# Patient Record
Sex: Male | Born: 1995 | Race: White | Hispanic: No | Marital: Single | Smoking: Current every day smoker
Health system: Southern US, Community
[De-identification: ages and names within clinical notes are randomized; demographics above are authoritative.]

## PROBLEM LIST (undated history)

## (undated) DIAGNOSIS — J42 Unspecified chronic bronchitis: Secondary | ICD-10-CM

## (undated) DIAGNOSIS — S90511A Abrasion, right ankle, initial encounter: Secondary | ICD-10-CM

## (undated) DIAGNOSIS — S62009A Unspecified fracture of navicular [scaphoid] bone of unspecified wrist, initial encounter for closed fracture: Secondary | ICD-10-CM

## (undated) DIAGNOSIS — Z8614 Personal history of Methicillin resistant Staphylococcus aureus infection: Secondary | ICD-10-CM

## (undated) DIAGNOSIS — Z98811 Dental restoration status: Secondary | ICD-10-CM

## (undated) DIAGNOSIS — R0981 Nasal congestion: Secondary | ICD-10-CM

## (undated) HISTORY — PX: WISDOM TOOTH EXTRACTION: SHX21

---
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
## 2015-11-06 ENCOUNTER — Encounter (HOSPITAL_COMMUNITY): Payer: Self-pay

## 2015-11-06 ENCOUNTER — Emergency Department (HOSPITAL_COMMUNITY): Payer: Self-pay

## 2015-11-06 ENCOUNTER — Emergency Department (HOSPITAL_COMMUNITY)
Admission: EM | Admit: 2015-11-06 | Discharge: 2015-11-06 | Disposition: A | Discharge status: Home / Self Care | Payer: Self-pay | Attending: Emergency Medicine | Admitting: Emergency Medicine

## 2015-11-06 DIAGNOSIS — W19XXXA Unspecified fall, initial encounter: Secondary | ICD-10-CM | POA: Insufficient documentation

## 2015-11-06 DIAGNOSIS — Y999 Unspecified external cause status: Secondary | ICD-10-CM | POA: Insufficient documentation

## 2015-11-06 DIAGNOSIS — Y92830 Public park as the place of occurrence of the external cause: Secondary | ICD-10-CM | POA: Insufficient documentation

## 2015-11-06 DIAGNOSIS — S62001A Unspecified fracture of navicular [scaphoid] bone of right wrist, initial encounter for closed fracture: Secondary | ICD-10-CM | POA: Insufficient documentation

## 2015-11-06 DIAGNOSIS — Y939 Activity, unspecified: Secondary | ICD-10-CM | POA: Insufficient documentation

## 2015-11-06 DIAGNOSIS — F17210 Nicotine dependence, cigarettes, uncomplicated: Secondary | ICD-10-CM | POA: Insufficient documentation

## 2015-11-06 DIAGNOSIS — F1290 Cannabis use, unspecified, uncomplicated: Secondary | ICD-10-CM | POA: Insufficient documentation

## 2015-11-06 DIAGNOSIS — S62009A Unspecified fracture of navicular [scaphoid] bone of unspecified wrist, initial encounter for closed fracture: Secondary | ICD-10-CM

## 2015-11-06 HISTORY — DX: Unspecified fracture of navicular (scaphoid) bone of unspecified wrist, initial encounter for closed fracture: S62.009A

## 2015-11-06 MED ORDER — IBUPROFEN 600 MG PO TABS: 600.0000 mg | ORAL_TABLET | Freq: Four times a day (QID) | ORAL | Status: AC | PRN

## 2015-11-06 MED ORDER — HYDROCODONE-ACETAMINOPHEN 5-325 MG PO TABS: 1.0000 | ORAL_TABLET | ORAL | Status: AC | PRN

## 2015-11-06 MED ORDER — HYDROCODONE-ACETAMINOPHEN 5-325 MG PO TABS
1.0000 | ORAL_TABLET | Freq: Once | ORAL | Status: AC
  Administered 2015-11-06: 1 via ORAL
  Filled 2015-11-06: qty 1

## 2015-11-06 NOTE — Discharge Instructions (Signed)
Please follow-up with Dr. Roda ShuttersXu with orthopedics for reevaluation of your wrist fracture. Take your pain medicine as prescribed and as we discussed. Return to ED for any new or worsening symptoms as we discussed.  Scaphoid Fracture, Wrist A fracture is a break in the bone. The bone you have broken often does not show up as a fracture on x-ray until later on in the healing phase. This bone is called the scaphoid bone. With this bone, your caregiver will often cast or splint your wrist as though it is fractured, even if a fracture is not seen on the x-ray. This is often done with wrist injuries in which there is tenderness at the base of the thumb. An x-ray at 1-3 weeks after your injury may confirm this fracture. A cast or splint is used to protect and keep your injured bone in good position for healing. The cast or splint will be on generally for about 6 to 16 weeks, depending on your health, age, the fracture location and how quickly you heal. Another name for the scaphoid bone is the navicular bone. HOME CARE INSTRUCTIONS   To lessen the swelling and pain, keep the injured part elevated above your heart while sitting or lying down.  Apply ice to the injury for 15-20 minutes, 03-04 times per day while awake, for 2 days. Put the ice in a plastic bag and place a thin towel between the bag of ice and your cast.  If you have a plaster or fiberglass cast or splint:  Do not try to scratch the skin under the cast using sharp or pointed objects.  Check the skin around the cast every day. You may put lotion on any red or sore areas.  Keep your cast or splint dry and clean.  If you have a plaster splint:  Wear the splint as directed.  You may loosen the elastic bandage around the splint if your fingers become numb, tingle, or turn cold or blue.  If you have been put in a removable splint, wear and use as directed.  Do not put pressure on any part of your cast or splint; it may deform or break. Rest  your cast or splint only on a pillow the first 24 hours until it is fully hardened.  Your cast or splint can be protected during bathing with a plastic bag. Do not lower the cast or splint into water.  Only take over-the-counter or prescription medicines for pain, discomfort, or fever as directed by your caregiver.  If your caregiver has given you a follow up appointment, it is very important to keep that appointment. Not keeping the appointment could result in chronic pain and decreased function. If there is any problem keeping the appointment, you must call back to this facility for assistance. SEEK IMMEDIATE MEDICAL CARE IF:   Your cast gets damaged, wet or breaks.  You have continued severe pain or more swelling than you did before the cast or splint was put on.  Your skin or nails below the injury turn blue or gray, or feel cold or numb.  You have tingling or burning pain in your fingers or increasing pain with movement of your fingers   This information is not intended to replace advice given to you by your health care provider. Make sure you discuss any questions you have with your health care provider.   Document Released: 04/23/2002 Document Revised: 07/26/2011 Document Reviewed: 11/13/2014 Elsevier Interactive Patient Education [employer]2016 Elsevier Inc.

## 2015-11-06 NOTE — ED Notes (Signed)
Pt c/o R wrist pain r/t a fall earlier today.  Pain score 6/10.  Pt reports that he "landed funny."  Limited ROM noted.

## 2015-11-06 NOTE — ED Provider Notes (Signed)
CSN: [id number]     Arrival date & time 11/06/15  1557 History  By signing my name below, I, Calvin Mckenzie, attest that this documentation has been prepared under the direction and in the presence of General MillsBenjamin Shiheem Corporan, PA-C.   Electronically Signed: Rosario AdieWilliam Andrew Mckenzie, ED Scribe. 11/06/2015. 4:47 PM.    Chief Complaint  Patient presents with  . Fall  . Wrist Injury   The history is provided by the patient. No language interpreter was used.   HPI Comments: Calvin Mckenzie is a 20 y.o. male who is right hand dominant with no pertinent PMHx who presents to the Emergency Department complaining of sudden onset, gradually worsening, constant, aching, 7/10 R wrist pain s/p fall that occurred 5 hours PTA. Pt reports that he was skateboarding when he fell with his right arm outstretched to catch himself. He has associated edema to the area of pain. His pain is aggravated upon movement of his right wrist and fingers. No OTC medications or home remedies tried PTA. Pt reports that he ate just PTA. He denies any other known injury or trauma. Pt is not complaining of any other symptoms at this time.   History reviewed. No pertinent past medical history. Past Surgical History  Procedure Laterality Date  . Wisdom tooth extraction     History reviewed. No pertinent family history. Social History  Substance Use Topics  . Smoking status: Current Every Day Smoker    Types: Cigarettes  . Smokeless tobacco: None  . Alcohol Use: Yes    Review of Systems A complete 10 system review of systems was obtained and all systems are negative except as noted in the HPI and PMH.   Allergies  Review of patient's allergies indicates not on file.  Home Medications   Prior to Admission medications   Medication Sig Start Date End Date Taking? Authorizing Provider  HYDROcodone-acetaminophen (NORCO/VICODIN) 5-325 MG tablet Take 1-2 tablets by mouth every 4 (four) hours as needed. 11/06/15   [name], PA-C   ibuprofen (ADVIL,MOTRIN) 600 MG tablet Take 1 tablet (600 mg total) by mouth every 6 (six) hours as needed. 11/06/15   [name], PA-C   BP 115/84 mmHg  Pulse 80  Temp(Src) 98.5 F (36.9 C) (Oral)  Resp 16  SpO2 97%   Physical Exam  Constitutional: He is oriented to person, place, and time. He appears well-developed and well-nourished. No distress.  HENT:  Head: Normocephalic and atraumatic.  Eyes: Conjunctivae are normal.  Neck: Normal range of motion.  Cardiovascular: Normal rate, regular rhythm and normal heart sounds.   No murmur heard. Pulmonary/Chest: Effort normal and breath sounds normal. No respiratory distress. He has no wheezes. He has no rales.  Abdominal: He exhibits no distension.  Musculoskeletal: He exhibits edema and tenderness.       Right wrist: He exhibits decreased range of motion (secondary to pain), tenderness and swelling.  Right hand: Tenderness in the base at the right thenar eminence, tenderness diffusely around distal right radius, with mild associated swelling. Pts distal pulses are intact, wrist capillary refill is 2+. Pt is able to perform cardinal hand movements wo difficulty. ROM decreased just secondary to pain. Right elbow: No elbow pain. Full range of motion  Neurological: He is alert and oriented to person, place, and time. No cranial nerve deficit. Coordination normal.  Skin: Skin is warm and dry.  Psychiatric: He has a normal mood and affect. Judgment normal.  Nursing note and vitals reviewed.  ED Course  Procedures (including critical care time)  DIAGNOSTIC STUDIES: Oxygen Saturation is 97% on RA, normal by my interpretation.   COORDINATION OF CARE: 4:40 PM-Discussed next steps with pt including DG R wrist, and pain management medications. Pt verbalized understanding and is agreeable with the plan.   Imaging Review Dg Wrist Complete Right  11/06/2015  CLINICAL DATA:  [place] [place]  Right wrist pain EXAM: RIGHT  WRIST - COMPLETE 3+ VIEW COMPARISON:  None. FINDINGS: There is an acute fracture through the waist of the scaphoid bone. Fracture fragments are nondisplaced. No radio-opaque foreign body or soft tissue calcifications. IMPRESSION: 1. Acute fracture involves the waist of the scaphoid bone. Electronically Signed   By: [name]  Stroud M.D.   On: 11/06/2015 16:55   I have personally reviewed and evaluated these images and lab results as part of my medical decision-making.  SPLINT APPLICATION Date/Time: 6:20 PM Authorized by: [name], [name] Consent: Verbal consent obtained. Risks and benefits: risks, benefits and alternatives were discussed Consent given by: patient Splint applied by: orthopedic technician Location details: R wrist Splint type: thumb spica Supplies used: ace wrap,hard splint  Post-procedure: The splinted body part was neurovascularly unchanged following the procedure. Patient tolerance: Patient tolerated the procedure well with no immediate complications.    MDM  Presents after mechanical fall, sustained a right scaphoid fracture. Neurovascularly intact. Thumb spica splint applied. Patient given follow-up with orthopedics, urged to call in the next 1-2 days for scheduling appointment. Pain medicine precautions discussed. Return precautions discussed. Appropriate for discharge. Final diagnoses:  Scaphoid fracture of wrist, right, closed, initial encounter    I personally performed the services described in this documentation, which was scribed in my presence. The recorded information has been reviewed and is accurate.   Joycie PeekBenjamin Shavonta Gossen, PA-C 11/06/15 1823  [name], MD 11/06/15 2139

## 2015-11-06 NOTE — ED Notes (Addendum)
Notified ortho tech of order to immobilize extremity, right wrist

## 2015-11-06 NOTE — Progress Notes (Addendum)
Pt taken for an x-ray of right wrist[place] [employer] [place] [employer] Friends are in the room waiting for the to come back from x-ray. Pt returned from the x-ray dept. (4:50pm) pt refused ice stating , "I have had it on before."

## 2015-11-12 ENCOUNTER — Other Ambulatory Visit: Payer: Self-pay | Admitting: Orthopaedic Surgery

## 2015-11-13 ENCOUNTER — Encounter (HOSPITAL_BASED_OUTPATIENT_CLINIC_OR_DEPARTMENT_OTHER): Payer: Self-pay | Admitting: *Deleted

## 2015-11-13 DIAGNOSIS — R0981 Nasal congestion: Secondary | ICD-10-CM

## 2015-11-13 DIAGNOSIS — S90511A Abrasion, right ankle, initial encounter: Secondary | ICD-10-CM

## 2015-11-13 HISTORY — DX: Nasal congestion: R09.81

## 2015-11-13 HISTORY — DX: Abrasion, right ankle, initial encounter: S90.511A

## 2015-11-19 ENCOUNTER — Encounter (HOSPITAL_BASED_OUTPATIENT_CLINIC_OR_DEPARTMENT_OTHER)
Admission: RE | Disposition: A | Discharge status: Home / Self Care | Payer: Self-pay | Source: Ambulatory Visit | Attending: Orthopaedic Surgery

## 2015-11-19 ENCOUNTER — Encounter (HOSPITAL_BASED_OUTPATIENT_CLINIC_OR_DEPARTMENT_OTHER): Payer: Self-pay | Admitting: Certified Registered"

## 2015-11-19 ENCOUNTER — Ambulatory Visit (HOSPITAL_BASED_OUTPATIENT_CLINIC_OR_DEPARTMENT_OTHER): Payer: Self-pay | Admitting: Certified Registered"

## 2015-11-19 ENCOUNTER — Ambulatory Visit (HOSPITAL_BASED_OUTPATIENT_CLINIC_OR_DEPARTMENT_OTHER)
Admission: RE | Admit: 2015-11-19 | Discharge: 2015-11-19 | Disposition: A | Discharge status: Home / Self Care | Payer: Self-pay | Source: Ambulatory Visit | Attending: Orthopaedic Surgery | Admitting: Orthopaedic Surgery

## 2015-11-19 DIAGNOSIS — Z8614 Personal history of Methicillin resistant Staphylococcus aureus infection: Secondary | ICD-10-CM | POA: Insufficient documentation

## 2015-11-19 DIAGNOSIS — F17210 Nicotine dependence, cigarettes, uncomplicated: Secondary | ICD-10-CM | POA: Insufficient documentation

## 2015-11-19 DIAGNOSIS — S62001A Unspecified fracture of navicular [scaphoid] bone of right wrist, initial encounter for closed fracture: Secondary | ICD-10-CM | POA: Insufficient documentation

## 2015-11-19 DIAGNOSIS — J42 Unspecified chronic bronchitis: Secondary | ICD-10-CM | POA: Insufficient documentation

## 2015-11-19 HISTORY — DX: Abrasion, right ankle, initial encounter: S90.511A

## 2015-11-19 HISTORY — DX: Nasal congestion: R09.81

## 2015-11-19 HISTORY — DX: Personal history of Methicillin resistant Staphylococcus aureus infection: Z86.14

## 2015-11-19 HISTORY — PX: ORIF SCAPHOID FRACTURE: SHX2130

## 2015-11-19 HISTORY — DX: Unspecified chronic bronchitis: J42

## 2015-11-19 HISTORY — DX: Dental restoration status: Z98.811

## 2015-11-19 HISTORY — DX: Unspecified fracture of navicular (scaphoid) bone of unspecified wrist, initial encounter for closed fracture: S62.009A

## 2015-11-19 SURGERY — OPEN REDUCTION INTERNAL FIXATION (ORIF) SCAPHOID FRACTURE
Anesthesia: General | Site: Wrist | Laterality: Right

## 2015-11-19 MED ORDER — FENTANYL CITRATE (PF) 100 MCG/2ML IJ SOLN: 25.0000 ug | INTRAMUSCULAR | Status: DC | PRN

## 2015-11-19 MED ORDER — LACTATED RINGERS IV SOLN: INTRAVENOUS | Status: DC

## 2015-11-19 MED ORDER — LIDOCAINE 2% (20 MG/ML) 5 ML SYRINGE
INTRAMUSCULAR | Status: AC
  Filled 2015-11-19: qty 5

## 2015-11-19 MED ORDER — METOCLOPRAMIDE HCL 5 MG/ML IJ SOLN: 10.0000 mg | Freq: Once | INTRAMUSCULAR | Status: DC | PRN

## 2015-11-19 MED ORDER — FENTANYL CITRATE (PF) 100 MCG/2ML IJ SOLN
INTRAMUSCULAR | Status: AC
  Filled 2015-11-19: qty 2

## 2015-11-19 MED ORDER — HYDROCODONE-ACETAMINOPHEN 5-325 MG PO TABS: 1.0000 | ORAL_TABLET | ORAL | Status: AC | PRN

## 2015-11-19 MED ORDER — LIDOCAINE HCL (CARDIAC) 20 MG/ML IV SOLN
INTRAVENOUS | Status: DC | PRN
  Administered 2015-11-19: 30 mg via INTRAVENOUS

## 2015-11-19 MED ORDER — PROPOFOL 500 MG/50ML IV EMUL
INTRAVENOUS | Status: AC
  Filled 2015-11-19: qty 50

## 2015-11-19 MED ORDER — GLYCOPYRROLATE 0.2 MG/ML IJ SOLN: 0.2000 mg | Freq: Once | INTRAMUSCULAR | Status: DC | PRN

## 2015-11-19 MED ORDER — DEXAMETHASONE SODIUM PHOSPHATE 10 MG/ML IJ SOLN
INTRAMUSCULAR | Status: AC
  Filled 2015-11-19: qty 1

## 2015-11-19 MED ORDER — ONDANSETRON HCL 4 MG PO TABS: 4.0000 mg | ORAL_TABLET | Freq: Three times a day (TID) | ORAL | Status: AC | PRN

## 2015-11-19 MED ORDER — LACTATED RINGERS IV SOLN
INTRAVENOUS | Status: DC
  Administered 2015-11-19 (×2): via INTRAVENOUS

## 2015-11-19 MED ORDER — DEXAMETHASONE SODIUM PHOSPHATE 10 MG/ML IJ SOLN
INTRAMUSCULAR | Status: DC | PRN
  Administered 2015-11-19: 10 mg via INTRAVENOUS

## 2015-11-19 MED ORDER — MIDAZOLAM HCL 2 MG/2ML IJ SOLN
1.0000 mg | INTRAMUSCULAR | Status: DC | PRN
  Administered 2015-11-19 (×2): 2 mg via INTRAVENOUS

## 2015-11-19 MED ORDER — CEFAZOLIN SODIUM-DEXTROSE 2-4 GM/100ML-% IV SOLN
INTRAVENOUS | Status: AC
  Filled 2015-11-19: qty 100

## 2015-11-19 MED ORDER — CEFAZOLIN SODIUM-DEXTROSE 2-4 GM/100ML-% IV SOLN
2.0000 g | INTRAVENOUS | Status: AC
  Administered 2015-11-19: 2 g via INTRAVENOUS

## 2015-11-19 MED ORDER — SCOPOLAMINE 1 MG/3DAYS TD PT72: 1.0000 | MEDICATED_PATCH | Freq: Once | TRANSDERMAL | Status: DC | PRN

## 2015-11-19 MED ORDER — MEPERIDINE HCL 25 MG/ML IJ SOLN: 6.2500 mg | INTRAMUSCULAR | Status: DC | PRN

## 2015-11-19 MED ORDER — ONDANSETRON HCL 4 MG/2ML IJ SOLN
INTRAMUSCULAR | Status: AC
  Filled 2015-11-19: qty 2

## 2015-11-19 MED ORDER — MIDAZOLAM HCL 2 MG/2ML IJ SOLN
INTRAMUSCULAR | Status: AC
  Filled 2015-11-19: qty 2

## 2015-11-19 MED ORDER — BUPIVACAINE-EPINEPHRINE (PF) 0.5% -1:200000 IJ SOLN
INTRAMUSCULAR | Status: DC | PRN
  Administered 2015-11-19: 20 mL via PERINEURAL

## 2015-11-19 MED ORDER — FENTANYL CITRATE (PF) 100 MCG/2ML IJ SOLN
50.0000 ug | INTRAMUSCULAR | Status: DC | PRN
  Administered 2015-11-19: 100 ug via INTRAVENOUS

## 2015-11-19 MED ORDER — ONDANSETRON HCL 4 MG/2ML IJ SOLN
INTRAMUSCULAR | Status: DC | PRN
  Administered 2015-11-19: 4 mg via INTRAVENOUS

## 2015-11-19 MED ORDER — BUPIVACAINE HCL (PF) 0.25 % IJ SOLN
INTRAMUSCULAR | Status: AC
  Filled 2015-11-19: qty 30

## 2015-11-19 MED ORDER — SENNOSIDES-DOCUSATE SODIUM 8.6-50 MG PO TABS: 1.0000 | ORAL_TABLET | Freq: Every evening | ORAL | Status: AC | PRN

## 2015-11-19 MED ORDER — PROPOFOL 10 MG/ML IV BOLUS
INTRAVENOUS | Status: DC | PRN
  Administered 2015-11-19: 200 mg via INTRAVENOUS

## 2015-11-19 SURGICAL SUPPLY — 64 items
BANDAGE ACE 3X5.8 VEL STRL LF (GAUZE/BANDAGES/DRESSINGS) ×3 IMPLANT
BANDAGE ACE 4X5 VEL STRL LF (GAUZE/BANDAGES/DRESSINGS) IMPLANT
BIT DRILL MINI LNG ACUTRAK 2 (BIT) IMPLANT
BLADE MINI RND TIP GREEN BEAV (BLADE) IMPLANT
BLADE SURG 15 STRL LF DISP TIS (BLADE) ×2 IMPLANT
BLADE SURG 15 STRL SS (BLADE) ×6
BNDG CMPR 9X4 STRL LF SNTH (GAUZE/BANDAGES/DRESSINGS) ×1
BNDG ESMARK 4X9 LF (GAUZE/BANDAGES/DRESSINGS) ×3 IMPLANT
BRUSH SCRUB EZ PLAIN DRY (MISCELLANEOUS) ×3 IMPLANT
CORDS BIPOLAR (ELECTRODE) ×3 IMPLANT
COVER BACK TABLE 60X90IN (DRAPES) ×3 IMPLANT
COVER MAYO STAND STRL (DRAPES) ×3 IMPLANT
CUFF TOURNIQUET SINGLE 18IN (TOURNIQUET CUFF) ×2 IMPLANT
DECANTER SPIKE VIAL GLASS SM (MISCELLANEOUS) IMPLANT
DRAPE C-ARM 42X72 X-RAY (DRAPES) ×3 IMPLANT
DRAPE EXTREMITY T 121X128X90 (DRAPE) ×3 IMPLANT
DRAPE SURG 17X23 STRL (DRAPES) ×3 IMPLANT
DRILL MINI LNG ACUTRAK 2 (BIT) ×3
GAUZE SPONGE 4X4 12PLY STRL (GAUZE/BANDAGES/DRESSINGS) ×3 IMPLANT
GAUZE SPONGE 4X4 16PLY XRAY LF (GAUZE/BANDAGES/DRESSINGS) IMPLANT
GAUZE XEROFORM 1X8 LF (GAUZE/BANDAGES/DRESSINGS) ×3 IMPLANT
GLOVE BIO SURGEON STRL SZ 6.5 (GLOVE) ×1 IMPLANT
GLOVE BIO SURGEONS STRL SZ 6.5 (GLOVE) ×1
GLOVE BIOGEL PI IND STRL 7.0 (GLOVE) IMPLANT
GLOVE BIOGEL PI INDICATOR 7.0 (GLOVE) ×4
GLOVE SKINSENSE NS SZ7.5 (GLOVE) ×2
GLOVE SKINSENSE STRL SZ7.5 (GLOVE) ×1 IMPLANT
GLOVE SURG SYN 7.5  E (GLOVE) ×2
GLOVE SURG SYN 7.5 E (GLOVE) ×1 IMPLANT
GLOVE SURG SYN 7.5 PF PI (GLOVE) ×1 IMPLANT
GOWN STRL REIN XL XLG (GOWN DISPOSABLE) ×3 IMPLANT
GOWN STRL REUS W/ TWL LRG LVL3 (GOWN DISPOSABLE) ×1 IMPLANT
GOWN STRL REUS W/TWL LRG LVL3 (GOWN DISPOSABLE) ×3
GUIDEWIRE ORTHO MINI ACTK .045 (WIRE) ×2 IMPLANT
NEEDLE HYPO 22GX1.5 SAFETY (NEEDLE) ×2 IMPLANT
NS IRRIG 1000ML POUR BTL (IV SOLUTION) ×3 IMPLANT
PACK BASIN DAY SURGERY FS (CUSTOM PROCEDURE TRAY) ×3 IMPLANT
PAD CAST 3X4 CTTN HI CHSV (CAST SUPPLIES) ×1 IMPLANT
PADDING CAST ABS 3INX4YD NS (CAST SUPPLIES)
PADDING CAST ABS COTTON 3X4 (CAST SUPPLIES) IMPLANT
PADDING CAST COTTON 3X4 STRL (CAST SUPPLIES) ×3
PADDING CAST SYNTHETIC 3 NS LF (CAST SUPPLIES)
PADDING CAST SYNTHETIC 3X4 NS (CAST SUPPLIES) IMPLANT
PADDING CAST SYNTHETIC 4 (CAST SUPPLIES)
PADDING CAST SYNTHETIC 4X4 STR (CAST SUPPLIES) IMPLANT
SCREW ACUTRAK 2 MINI 18MM (Screw) ×2 IMPLANT
SLEEVE SCD COMPRESS KNEE MED (MISCELLANEOUS) ×3 IMPLANT
SPLINT FIBERGLASS 3X35 (CAST SUPPLIES) ×2 IMPLANT
SPONGE LAP 18X18 X RAY DECT (DISPOSABLE) ×3 IMPLANT
STOCKINETTE 4X48 STRL (DRAPES) ×3 IMPLANT
SUCTION FRAZIER HANDLE 10FR (MISCELLANEOUS) ×2
SUCTION TUBE FRAZIER 10FR DISP (MISCELLANEOUS) ×1 IMPLANT
SUT ETHILON 3 0 PS 1 (SUTURE) ×3 IMPLANT
SUT VIC AB 2-0 SH 27 (SUTURE) ×3
SUT VIC AB 2-0 SH 27XBRD (SUTURE) ×1 IMPLANT
SYR BULB 3OZ (MISCELLANEOUS) ×3 IMPLANT
SYR CONTROL 10ML LL (SYRINGE) ×2 IMPLANT
TOWEL OR 17X24 6PK STRL BLUE (TOWEL DISPOSABLE) ×6 IMPLANT
TOWEL OR NON WOVEN STRL DISP B (DISPOSABLE) ×3 IMPLANT
TRAY DSU PREP LF (CUSTOM PROCEDURE TRAY) ×3 IMPLANT
TUBE CONNECTING 20'X1/4 (TUBING) ×1
TUBE CONNECTING 20X1/4 (TUBING) ×2 IMPLANT
UNDERPAD 30X30 (UNDERPADS AND DIAPERS) ×3 IMPLANT
YANKAUER SUCT BULB TIP NO VENT (SUCTIONS) IMPLANT

## 2015-11-19 NOTE — Anesthesia Procedure Notes (Addendum)
Anesthesia Regional Block:  Supraclavicular block  Pre-Anesthetic Checklist: ,, timeout performed, Correct Patient, Correct Site, Correct Laterality, Correct Procedure, Correct Position, site marked, Risks and benefits discussed,  Surgical consent,  Pre-op evaluation,  At surgeon's request and post-op pain management  Laterality: Right and Upper  Prep: Maximum Sterile Barrier Precautions used and chloraprep       Needles:  Injection technique: Single-shot  Needle Type: Echogenic Stimulator Needle     Needle Length: 10cm 10 cm Needle Gauge: 21 and 21 G    Additional Needles:  Procedures: ultrasound guided (picture in chart) Supraclavicular block Narrative:  Injection made incrementally with aspirations every 5 mL.  Performed by: Personally   Additional Notes: Risks, benefits and alternative to block explained extensively.  Patient tolerated procedure well, without complications.   Procedure Name: LMA Insertion Date/Time: 11/19/2015 8:28 AM Performed by: Mela Perham D Pre-anesthesia Checklist: Patient identified, Emergency Drugs available, Suction available and Patient being monitored Patient Re-evaluated:Patient Re-evaluated prior to inductionOxygen Delivery Method: Circle system utilized Preoxygenation: Pre-oxygenation with 100% oxygen Intubation Type: IV induction Ventilation: Mask ventilation without difficulty LMA: LMA inserted LMA Size: 4.0 Number of attempts: 1 Airway Equipment and Method: Bite block Placement Confirmation: positive ETCO2 Tube secured with: Tape Dental Injury: Teeth and Oropharynx as per pre-operative assessment

## 2015-11-19 NOTE — H&P (Signed)
    PREOPERATIVE H&P  Chief Complaint: right scaphoid fracture  HPI: Calvin Mckenzie is a 20 y.o. male who presents for surgical treatment of right scaphoid fracture.  He denies any changes in medical history.  Past Medical History  Diagnosis Date  . Chronic bronchitis (HCC)     prn inhaler  . Stuffy nose 11/13/2015  . Dental crown present   . Scaphoid fracture of wrist 11/06/2015    right  . Abrasion of right ankle 11/13/2015  . History of MRSA infection age 89    groin   Past Surgical History  Procedure Laterality Date  . Wisdom tooth extraction     Social History   Social History  . Marital Status: Single    Spouse Name: N/A  . Number of Children: N/A  . Years of Education: N/A   Social History Main Topics  . Smoking status: Current Every Day Smoker -- 0.50 packs/day for 7 years    Types: Cigarettes  . Smokeless tobacco: Never Used  . Alcohol Use: Yes     Comment: 2 x/week  . Drug Use: Yes    Special: Marijuana     Comment: last used 2 weeks ago  . Sexual Activity: Not Asked   Other Topics Concern  . None   Social History Narrative   History reviewed. No pertinent family history. No Known Allergies Prior to Admission medications   Medication Sig Start Date End Date Taking? Authorizing Provider  albuterol (PROVENTIL HFA;VENTOLIN HFA) 108 (90 Base) MCG/ACT inhaler Inhale into the lungs every 6 (six) hours as needed for wheezing or shortness of breath.   Yes Historical Provider, MD  HYDROcodone-acetaminophen (NORCO/VICODIN) 5-325 MG tablet Take 1-2 tablets by mouth every 4 (four) hours as needed. 11/06/15  Yes Benjamin Cartner, PA-C  ibuprofen (ADVIL,MOTRIN) 600 MG tablet Take 1 tablet (600 mg total) by mouth every 6 (six) hours as needed. 11/06/15  Yes Benjamin Cartner, PA-C     Positive ROS: All other systems have been reviewed and were otherwise negative with the exception of those mentioned in the HPI and as above.  Physical Exam: General: Alert, no acute  distress Cardiovascular: No pedal edema Respiratory: No cyanosis, no use of accessory musculature GI: abdomen soft Skin: No lesions in the area of chief complaint Neurologic: Sensation intact distally Psychiatric: Patient is competent for consent with normal mood and affect Lymphatic: no lymphedema  MUSCULOSKELETAL: exam stable  Assessment: right scaphoid fracture  Plan: Plan for Procedure(s): OPEN REDUCTION INTERNAL FIXATION (ORIF) RIGHT SCAPHOID FRACTURE  The risks benefits and alternatives were discussed with the patient including but not limited to the risks of nonoperative treatment, versus surgical intervention including infection, bleeding, nerve injury,  blood clots, cardiopulmonary complications, morbidity, mortality, among others, and they were willing to proceed.   Cheral AlmasXu, Calvin Frimpong Michael, MD   11/19/2015 7:13 AM

## 2015-11-19 NOTE — Progress Notes (Signed)
Assisted Dr. Carignan with right, ultrasound guided, supraclavicular block. Side rails up, monitors on throughout procedure. See vital signs in flow sheet. Tolerated Procedure well. 

## 2015-11-19 NOTE — Op Note (Signed)
   DATE OF SURGERY:11/19/2015  PREOPERATIVE DIAGNOSIS:  Right scaphoid fracture  POSTOPERATIVE DIAGNOSIS: same  PROCEDURE:  Right osteosynthesis of scaphoid fracture  IMPLANT:  Implant Name Type Inv. Item Serial No. Manufacturer Lot No. LRB No. Used  LOG [id number] - [employer] ACCUTRAK II - 1         SCREW ACUTRAK 2 MINI 18MM - [id number] Screw SCREW ACUTRAK 2 MINI 18MM   [employer]   Right 1    SURGEON: Surgeon(s): Naiping Donnelly StagerM Xu, MD  ANESTHESIA:  General  TOURNIQUET TIME: Total Tourniquet Time Documented: Upper Arm (Right) - 23 minutes Total: Upper Arm (Right) - 23 minutes .  BLOOD LOSS: Minimal.  COMPLICATIONS: None.  PATHOLOGY: None.  TIME OUT: A time out was performed before the procedure started.  INDICATIONS: The patient is a 20 y.o. -year-old male who presented with right scaphoid fracture indicated for osteosynthesis.  DESCRIPTION OF PROCEDURE:  Supine position, regional block, prophylactic antibiotics, well-padded arm tourniquet, Hibiclens and alcohol prep, sterile drape.  Exsanguination was achieved.  Tourniquet was inflated to 250 mmHg.  A standard Acutrak guidewire was inserted from proximal pole of the scaphoid along the long axis of the scaphoid both on AP and lateral views.  A small incision dorsally was made.  Blunt dissection around the pin was performed to ensure no entrapment of tendons.   A cannulated drill was used to drill over the guidewire into the scaphoid.  The length was measured.  The acutrak screw was inserted over the wire to the appropriate depth.  Multiple fluoroscopic views were taken confirming the screw inside the scaphoid without violating the articular surface.  At this point, all wires were removed.  Final fluoroscopy imaging was taken and wound was irrigated and closed using 4-0 nylon sutures.  Sterile dressing applied.  Tourniquet was deflated.  A thumb spica splint was applied.  All counts were correct.  The patient was transferred to the recovery  room under stable condition.  Postoperatively, will immobilize in a cast for four weeks and switch to a removable thumb spica brace until fracture union achieves.  Mayra ReelN. Michael Xu, MD [facility] [phone] 9:15 AM

## 2015-11-19 NOTE — Anesthesia Preprocedure Evaluation (Addendum)
Anesthesia Evaluation  Patient identified by MRN, date of birth, ID band Patient awake    Reviewed: Allergy & Precautions, NPO status , Patient's Chart, lab work & pertinent test results  Airway Mallampati: II  TM Distance: >3 FB Neck ROM: Full    Dental no notable dental hx.    Pulmonary Current Smoker,    Pulmonary exam normal breath sounds clear to auscultation       Cardiovascular negative cardio ROS Normal cardiovascular exam Rhythm:Regular Rate:Normal     Neuro/Psych negative neurological ROS  negative psych ROS   GI/Hepatic negative GI ROS, Neg liver ROS,   Endo/Other  negative endocrine ROS  Renal/GU negative Renal ROS  negative genitourinary   Musculoskeletal negative musculoskeletal ROS (+)   Abdominal   Peds negative pediatric ROS (+)  Hematology negative hematology ROS (+)   Anesthesia Other Findings   Reproductive/Obstetrics negative OB ROS                          Anesthesia Physical Anesthesia Plan  ASA: II  Anesthesia Plan: General   Post-op Pain Management: GA combined w/ Regional for post-op pain   Induction: Intravenous  Airway Management Planned: LMA  Additional Equipment:   Intra-op Plan:   Post-operative Plan: Extubation in OR  Informed Consent: I have reviewed the patients History and Physical, chart, labs and discussed the procedure including the risks, benefits and alternatives for the proposed anesthesia with the patient or authorized representative who has indicated his/her understanding and acceptance.   Dental advisory given  Plan Discussed with: CRNA  Anesthesia Plan Comments: (SCB)        Anesthesia Quick Evaluation

## 2015-11-19 NOTE — Discharge Instructions (Signed)
Postoperative instructions: ° °Weightbearing: non weight bearing ° °Dressing instructions: Keep your dressing and/or splint clean and dry at all times.  It will be removed at your first post-operative appointment.  Your stitches and/or staples will be removed at this visit. ° °Incision instructions:  Do not soak your incision for 3 weeks after surgery.  If the incision gets wet, pat dry and do not scrub the incision. ° °Pain control:  You have been given a prescription to be taken as directed for post-operative pain control.  In addition, elevate the operative extremity above the heart at all times to prevent swelling and throbbing pain. ° °Take over-the-counter Colace, 100mg by mouth twice a day while taking narcotic pain medications to help prevent constipation. ° °Follow up appointments: °1) 10-14 days for suture removal and wound check. °2) Dr. Xu as scheduled. ° ° ------------------------------------------------------------------------------------------------------------- ° °After Surgery Pain Control: ° °After your surgery, post-surgical discomfort or pain is likely. This discomfort can last several days to a few weeks. At certain times of the day your discomfort may be more intense.  °Did you receive a nerve block?  °A nerve block can provide pain relief for one hour to two days after your surgery. As long as the nerve block is working, you will experience little or no sensation in the area the surgeon operated on.  °As the nerve block wears off, you will begin to experience pain or discomfort. It is very important that you begin taking your prescribed pain medication before the nerve block fully wears off. Treating your pain at the first sign of the block wearing off will ensure your pain is better controlled and more tolerable when full-sensation returns. Do not wait until the pain is intolerable, as the medicine will be less effective. It is better to treat pain in advance than to try and catch up.    °General Anesthesia:  °If you did not receive a nerve block during your surgery, you will need to start taking your pain medication shortly after your surgery and should continue to do so as prescribed by your surgeon.  °Pain Medication:  °Most commonly we prescribe Vicodin and Percocet for post-operative pain. Both of these medications contain a combination of acetaminophen (Tylenol®) and a narcotic to help control pain.  °· It takes between 30 and 45 minutes before pain medication starts to work. It is important to take your medication before your pain level gets too intense.  °· Nausea is a common side effect of many pain medications. You will want to eat something before taking your pain medicine to help prevent nausea.  °· If you are taking a prescription pain medication that contains acetaminophen, we recommend that you do not take additional over the counter acetaminophen (Tylenol®).  °Other pain relieving options:  °· Using a cold pack to ice the affected area a few times a day (15 to 20 minutes at a time) can help to relieve pain, reduce swelling and bruising.  °· Elevation of the affected area can also help to reduce pain and swelling. ° ° ° ° °Post Anesthesia Home Care Instructions ° °Activity: °Get plenty of rest for the remainder of the day. A responsible adult should stay with you for 24 hours following the procedure.  °For the next 24 hours, DO NOT: °-Drive a car °-Operate machinery °-Drink alcoholic beverages °-Take any medication unless instructed by your physician °-Make any legal decisions or sign important papers. ° °Meals: °Start with liquid foods such as gelatin   or soup. Progress to regular foods as tolerated. Avoid greasy, spicy, heavy foods. If nausea and/or vomiting occur, drink only clear liquids until the nausea and/or vomiting subsides. Call your physician if vomiting continues. ° °Special Instructions/Symptoms: °Your throat may feel dry or sore from the anesthesia or the breathing tube  placed in your throat during surgery. If this causes discomfort, gargle with warm salt water. The discomfort should disappear within 24 hours. ° °If you had a scopolamine patch placed behind your ear for the management of post- operative nausea and/or vomiting: ° °1. The medication in the patch is effective for 72 hours, after which it should be removed.  Wrap patch in a tissue and discard in the trash. Wash hands thoroughly with soap and water. °2. You may remove the patch earlier than 72 hours if you experience unpleasant side effects which may include dry mouth, dizziness or visual disturbances. °3. Avoid touching the patch. Wash your hands with soap and water after contact with the patch. °  °Regional Anesthesia Blocks ° °1. Numbness or the inability to move the "blocked" extremity may last from 3-48 hours after placement. The length of time depends on the medication injected and your individual response to the medication. If the numbness is not going away after 48 hours, call your surgeon. ° °2. The extremity that is blocked will need to be protected until the numbness is gone and the  Strength has returned. Because you cannot feel it, you will need to take extra care to avoid injury. Because it may be weak, you may have difficulty moving it or using it. You may not know what position it is in without looking at it while the block is in effect. ° °3. For blocks in the legs and feet, returning to weight bearing and walking needs to be done carefully. You will need to wait until the numbness is entirely gone and the strength has returned. You should be able to move your leg and foot normally before you try and bear weight or walk. You will need someone to be with you when you first try to ensure you do not fall and possibly risk injury. ° °4. Bruising and tenderness at the needle site are common side effects and will resolve in a few days. ° °5. Persistent numbness or new problems with movement should be  communicated to the surgeon or the  Surgery Center ([phone])/ [facility] ([phone]). ° ° °

## 2015-11-19 NOTE — Anesthesia Postprocedure Evaluation (Signed)
Anesthesia Post Note  Patient: Calvin Mckenzie  Procedure(s) Performed: Procedure(s) (LRB): OPEN REDUCTION INTERNAL FIXATION (ORIF) RIGHT SCAPHOID FRACTURE (Right)  Patient location during evaluation: PACU Anesthesia Type: General and Regional Level of consciousness: awake and alert Pain management: pain level controlled Vital Signs Assessment: post-procedure vital signs reviewed and stable Respiratory status: spontaneous breathing, nonlabored ventilation, respiratory function stable and patient connected to nasal cannula oxygen Cardiovascular status: blood pressure returned to baseline and stable Postop Assessment: no signs of nausea or vomiting Anesthetic complications: no    Last Vitals:  Filed Vitals:   11/19/15 0930 11/19/15 0945  BP: 124/77 122/77  Pulse: 67 70  Temp:    Resp: 14 16    Last Pain:  Filed Vitals:   11/19/15 0948  PainSc: Asleep                 Phillips Groutarignan, Chaniah Cisse

## 2015-11-19 NOTE — Transfer of Care (Signed)
Immediate Anesthesia Transfer of Care Note  Patient: Calvin HazardMatthew Roulston  Procedure(s) Performed: Procedure(s): OPEN REDUCTION INTERNAL FIXATION (ORIF) RIGHT SCAPHOID FRACTURE (Right)  Patient Location: PACU  Anesthesia Type:GA combined with regional for post-op pain  Level of Consciousness: awake, alert , oriented and patient cooperative  Airway & Oxygen Therapy: Patient Spontanous Breathing and Patient connected to face mask oxygen  Post-op Assessment: Report given to RN and Post -op Vital signs reviewed and stable  Post vital signs: Reviewed and stable  Last Vitals:  Filed Vitals:   11/19/15 0810 11/19/15 0815  BP:    Pulse: 86 84  Temp:    Resp: 22 19    Last Pain:  Filed Vitals:   11/19/15 0852  PainSc: 0-No pain      Patients Stated Pain Goal: 0 (11/19/15 0723)  Complications: No apparent anesthesia complications

## 2015-11-25 ENCOUNTER — Encounter (HOSPITAL_BASED_OUTPATIENT_CLINIC_OR_DEPARTMENT_OTHER): Payer: Self-pay | Admitting: Orthopaedic Surgery

## 2017-10-04 IMAGING — CR DG WRIST COMPLETE 3+V*R*
4 series · 4 of 4 positions shown · non-contrast
Comparison: None.

CLINICAL DATA: [place]  Right wrist pain

EXAM:
RIGHT WRIST - COMPLETE 3+ VIEW

[x wrist pa right]
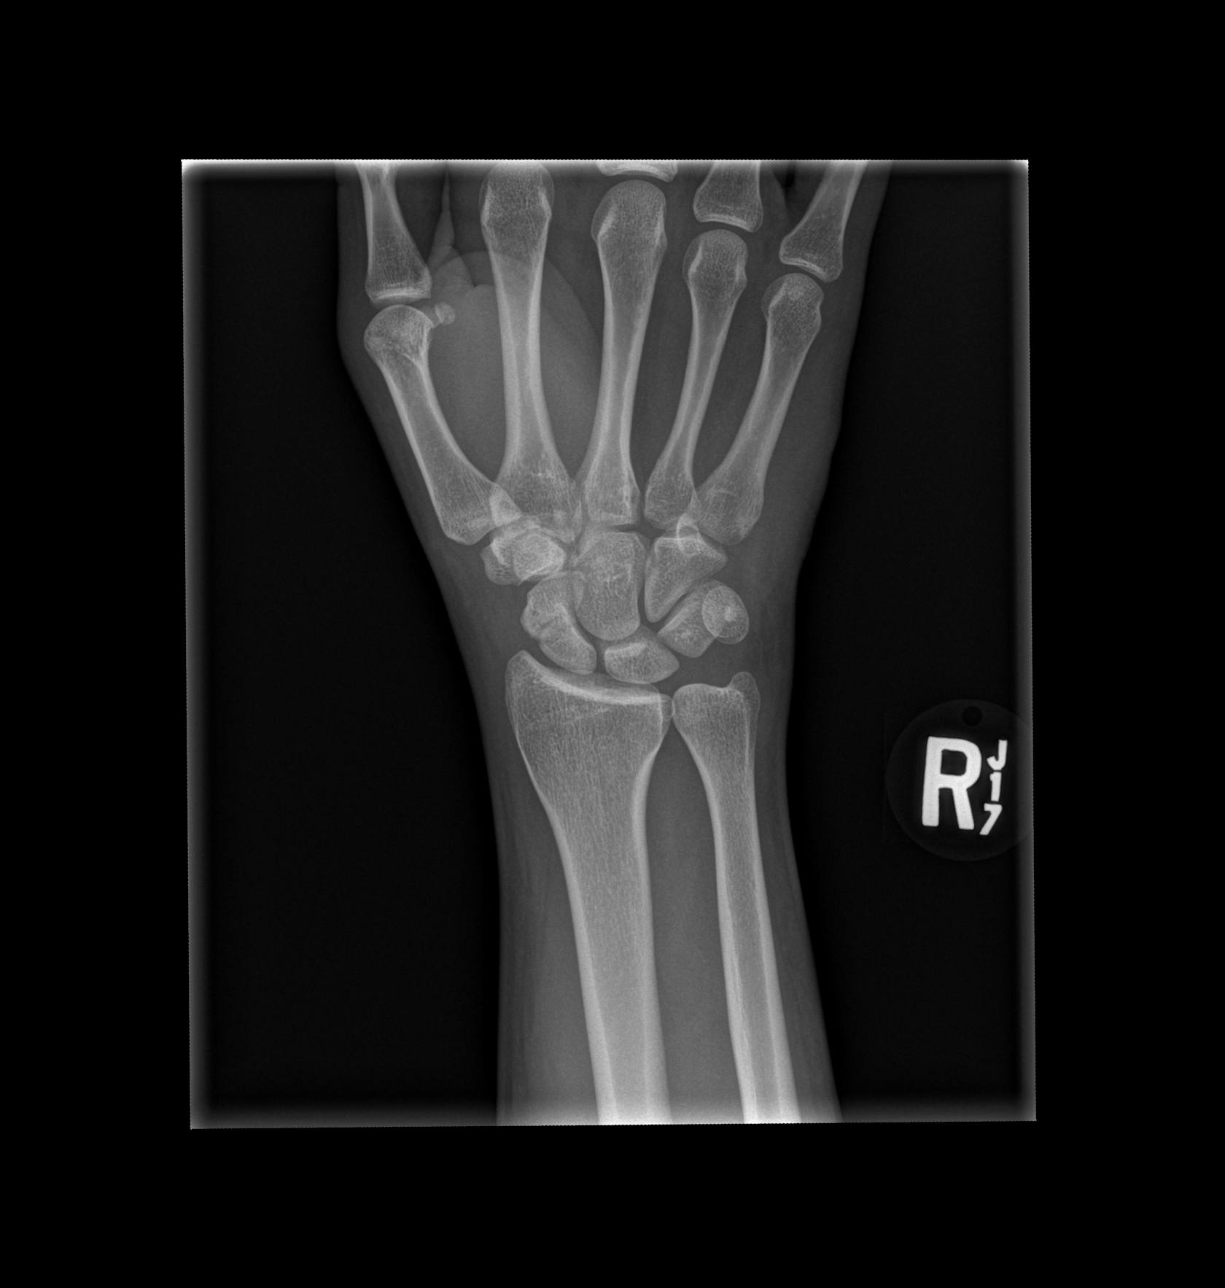

[x wrist obl right]
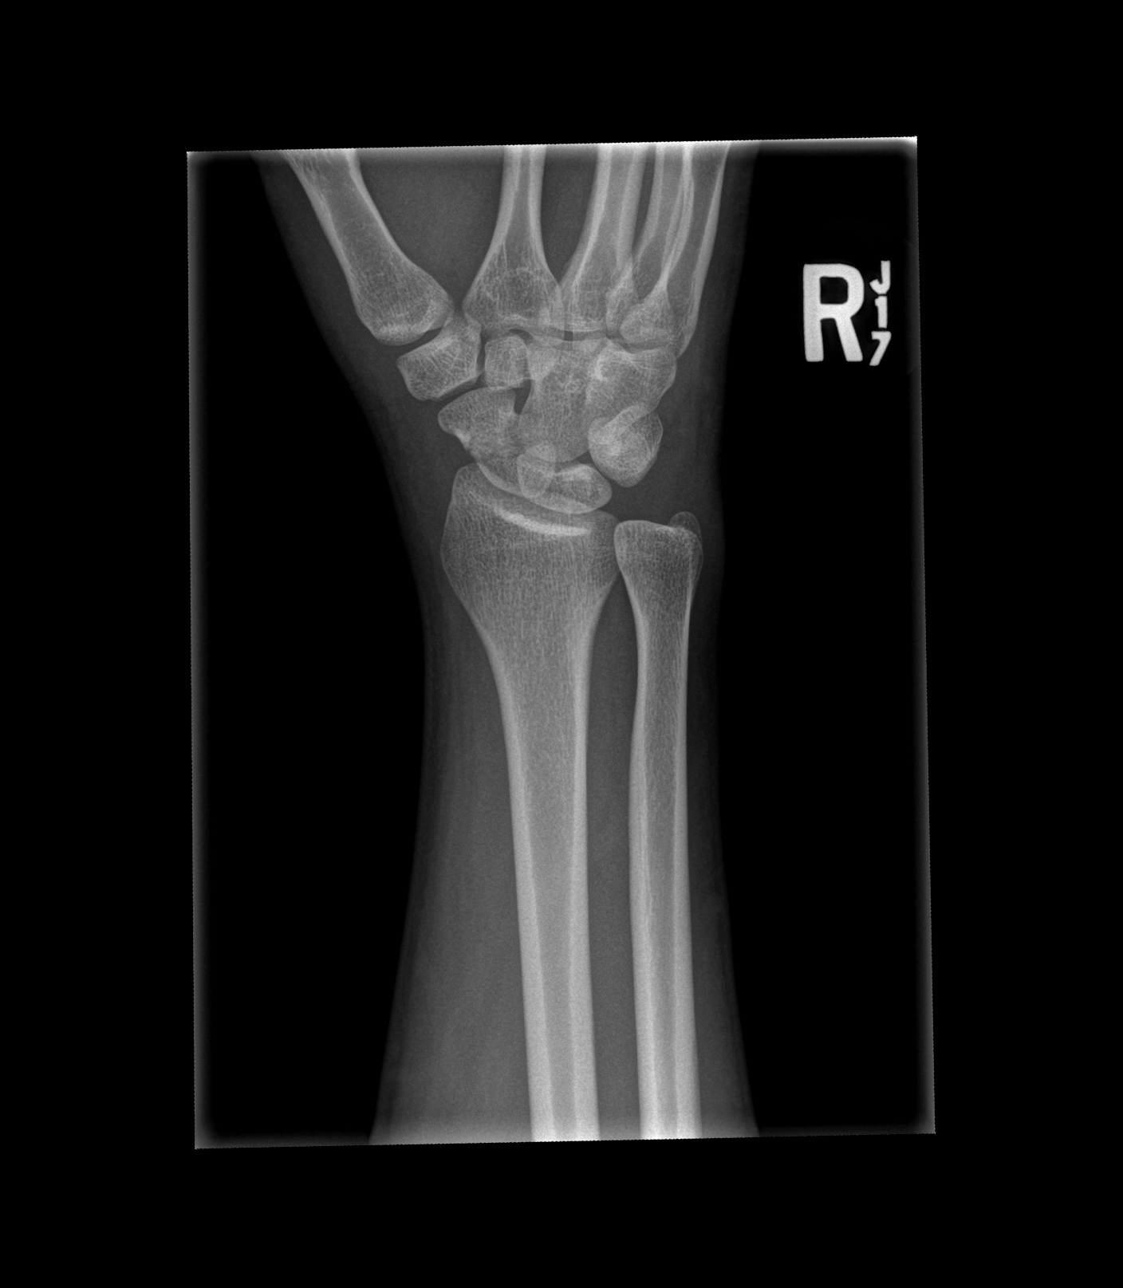

[x wrist lat right]
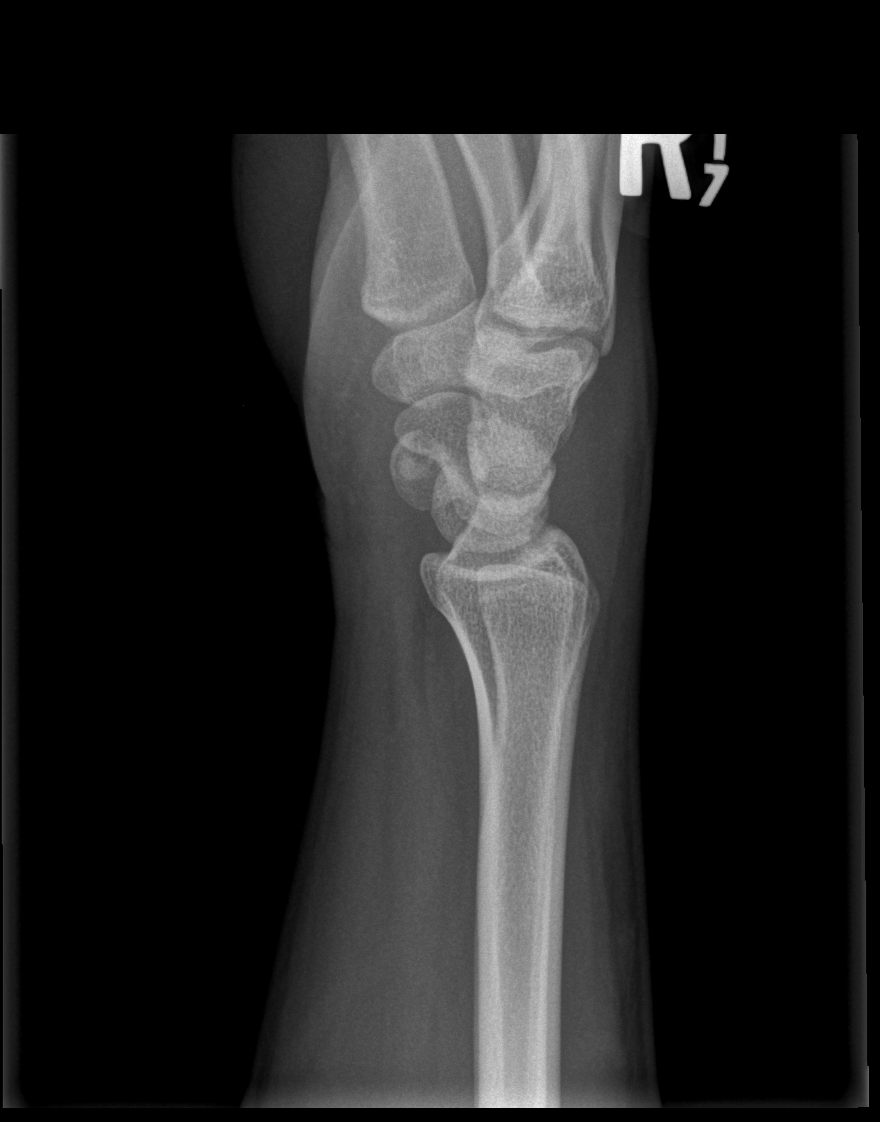

[x wrist navicular view right]
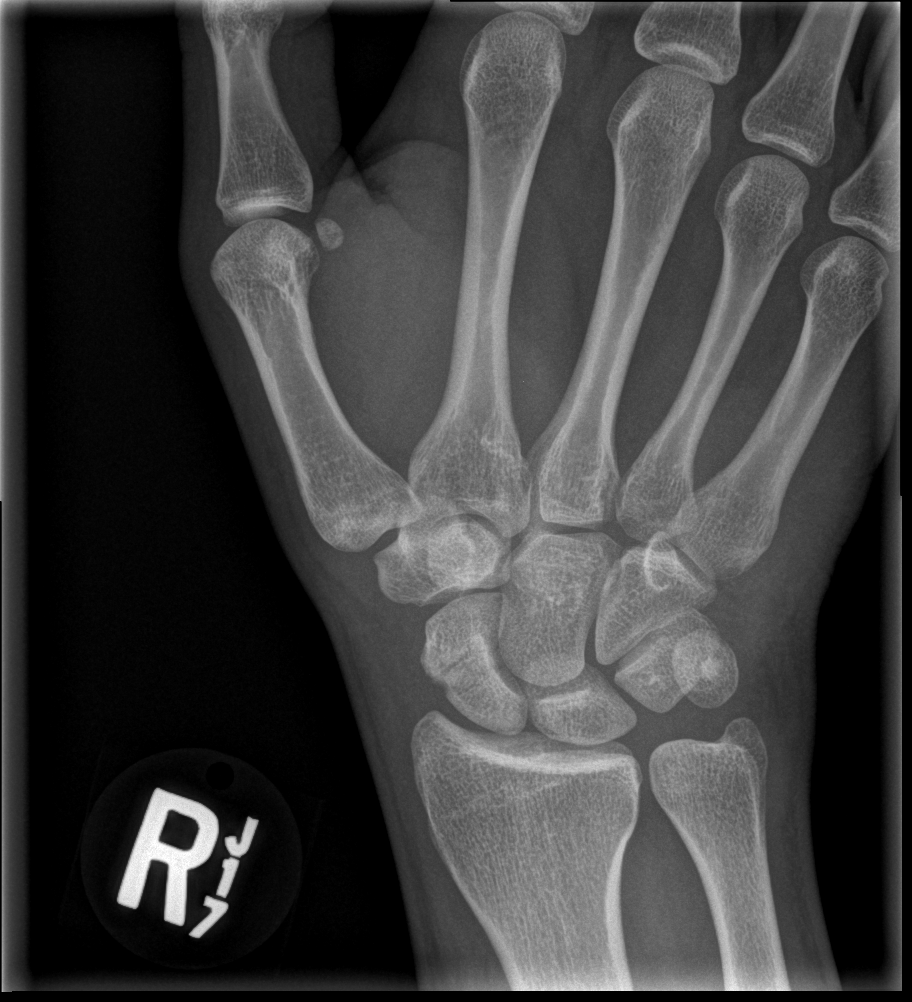

[4 of 4 positions shown; findings below may reference images not displayed]

FINDINGS: There is an acute fracture through the waist of the scaphoid bone.
Fracture fragments are nondisplaced. No radio-opaque foreign body or
soft tissue calcifications.
IMPRESSION: 1. Acute fracture involves the waist of the scaphoid bone.

## 2018-01-13 ENCOUNTER — Emergency Department (HOSPITAL_COMMUNITY)
Admission: EM | Admit: 2018-01-13 | Discharge: 2018-01-13 | Disposition: A | Discharge status: Home / Self Care | Payer: Self-pay | Attending: Emergency Medicine | Admitting: Emergency Medicine

## 2018-01-13 ENCOUNTER — Encounter (HOSPITAL_COMMUNITY): Payer: Self-pay

## 2018-01-13 DIAGNOSIS — F17210 Nicotine dependence, cigarettes, uncomplicated: Secondary | ICD-10-CM | POA: Insufficient documentation

## 2018-01-13 DIAGNOSIS — F10920 Alcohol use, unspecified with intoxication, uncomplicated: Secondary | ICD-10-CM | POA: Insufficient documentation

## 2018-01-13 DIAGNOSIS — R1110 Vomiting, unspecified: Secondary | ICD-10-CM | POA: Insufficient documentation

## 2018-01-13 DIAGNOSIS — F1210 Cannabis abuse, uncomplicated: Secondary | ICD-10-CM | POA: Insufficient documentation

## 2018-01-13 MED ORDER — ONDANSETRON HCL 4 MG/2ML IJ SOLN
4.0000 mg | Freq: Once | INTRAMUSCULAR | Status: AC
Start: 2018-01-13 — End: 2018-01-13
  Administered 2018-01-13: 4 mg via INTRAVENOUS
  Filled 2018-01-13: qty 2

## 2018-01-13 MED ORDER — SODIUM CHLORIDE 0.9 % IV BOLUS (SEPSIS)
1000.0000 mL | Freq: Once | INTRAVENOUS | Status: AC
Start: 2018-01-13 — End: 2018-01-13
  Administered 2018-01-13: 1000 mL via INTRAVENOUS

## 2018-01-13 NOTE — Discharge Instructions (Signed)
You can take the bus to your car. Stop drinking alcohol. Return here for new/acute changes.

## 2018-01-13 NOTE — ED Notes (Signed)
Attempted to obtain oral temperature on patient but patient refused to keep probe in mouth, stating the probe was making him feel nauseated.

## 2018-01-13 NOTE — ED Triage Notes (Signed)
Pt [place] by EMS from bar. EMS reports that Pt was found out in grass on arrival. Pt reports that he had 4-5 shots along with 4-5 beers. EMS reports that Pt required NPA but now is alert and oriented.

## 2018-01-13 NOTE — ED Notes (Signed)
Went over discharge instructions with Pt. Pt verbalized understanding. Removed IV and informed Pt on how to catch bus back home. Pt verbalized understanding and ambulated out of facility.

## 2018-01-13 NOTE — ED Provider Notes (Signed)
[facility]-EMERGENCY DEPT Provider Note   CSN: [id number] Arrival date & time: 01/13/18  0128     History   Chief Complaint Chief Complaint  Patient presents with  . Alcohol Intoxication    HPI Calvin Mckenzie is a 22 y.o. male.  The history is provided by medical records and the EMS personnel.  Alcohol Intoxication     Level 5 caveat: Intoxication  22 year old male here from a bar acutely intoxicated.  Apparently, bystander from the bar called EMS.  On their arrival he was laying in the grass and had been vomiting.  Patient reported to EMS that he had about 4 or 5 shots as well as 4 5 beers.  Patient did have some emesis and EMS in route.  Past Medical History:  Diagnosis Date  . Abrasion of right ankle 11/13/2015  . Chronic bronchitis (HCC)    prn inhaler  . Dental crown present   . History of MRSA infection age 22   groin  . Scaphoid fracture of wrist 11/06/2015   right  . Stuffy nose 11/13/2015    There are no active problems to display for this patient.   Past Surgical History:  Procedure Laterality Date  . ORIF SCAPHOID FRACTURE Right 11/19/2015   Procedure: OPEN REDUCTION INTERNAL FIXATION (ORIF) RIGHT SCAPHOID FRACTURE;  Surgeon: Tarry KosNaiping M Xu, MD;  Location: [facility];  Service: Orthopedics;  Laterality: Right;  . WISDOM TOOTH EXTRACTION          Home Medications    Prior to Admission medications   Medication Sig Start Date End Date Taking? Authorizing Provider  albuterol (PROVENTIL HFA;VENTOLIN HFA) 108 (90 Base) MCG/ACT inhaler Inhale into the lungs every 6 (six) hours as needed for wheezing or shortness of breath.    [provider]  HYDROcodone-acetaminophen (NORCO) 5-325 MG tablet Take 1 tablet by mouth every 4 (four) hours as needed. 11/19/15   Tarry KosXu, Naiping M, MD  HYDROcodone-acetaminophen (NORCO/VICODIN) 5-325 MG tablet Take 1-2 tablets by mouth every 4 (four) hours as needed. 11/06/15   Cartner, Sharlet SalinaBenjamin,  PA-C  ibuprofen (ADVIL,MOTRIN) 600 MG tablet Take 1 tablet (600 mg total) by mouth every 6 (six) hours as needed. 11/06/15   Cartner, Sharlet SalinaBenjamin, PA-C  ondansetron (ZOFRAN) 4 MG tablet Take 1-2 tablets (4-8 mg total) by mouth every 8 (eight) hours as needed for nausea or vomiting. 11/19/15   Tarry KosXu, Naiping M, MD  senna-docusate (SENOKOT S) 8.6-50 MG tablet Take 1 tablet by mouth at bedtime as needed. 11/19/15   Tarry KosXu, Naiping M, MD    Family History History reviewed. No pertinent family history.  Social History Social History   Tobacco Use  . Smoking status: Current Every Day Smoker    Packs/day: 0.50    Years: 7.00    Pack years: 3.50    Types: Cigarettes  . Smokeless tobacco: Never Used  Substance Use Topics  . Alcohol use: Yes  . Drug use: Not Currently    Types: Marijuana    Comment: last used 2 weeks ago     Allergies   Patient has no known allergies.   Review of Systems Review of Systems  Unable to perform ROS: Other     Physical Exam Updated Vital Signs BP 110/79   Pulse 81   Resp 16   Ht 5\' 8"  (1.727 m)   Wt 72.6 kg   SpO2 97%   BMI 24.33 kg/m   Physical Exam  Constitutional: He is oriented to person,  place, and time. He appears well-developed and well-nourished.  Appears intoxicated  HENT:  Head: Normocephalic and atraumatic.  Mouth/Throat: Oropharynx is clear and moist.  Eyes: Pupils are equal, round, and reactive to light. Conjunctivae and EOM are normal.  Neck: Normal range of motion.  Cardiovascular: Normal rate, regular rhythm and normal heart sounds.  Pulmonary/Chest: Effort normal and breath sounds normal. No stridor. No respiratory distress.  Abdominal: Soft. Bowel sounds are normal. There is no tenderness. There is no rebound.  Musculoskeletal: Normal range of motion.  Neurological: He is alert and oriented to person, place, and time.  Skin: Skin is warm and dry.  Psychiatric: He has a normal mood and affect.  Nursing note and vitals  reviewed.    ED Treatments / Results  Labs (all labs ordered are listed, but only abnormal results are displayed) Labs Reviewed - No data to display  EKG None  Radiology No results found.  Procedures Procedures (including critical care time)  Medications Ordered in ED Medications  ondansetron (ZOFRAN) injection 4 mg (4 mg Intravenous Given 01/13/18 0144)  sodium chloride 0.9 % bolus 1,000 mL (0 mLs Intravenous Stopped 01/13/18 0214)     Initial Impression / Assessment and Plan / ED Course  I have reviewed the triage vital signs and the nursing notes.  Pertinent labs & imaging results that were available during my care of the patient were reviewed by me and considered in my medical decision making (see chart for details).  22 y.o. M here acutely intoxicated. On EMS arrival, patient found in the grass.  Reports drinking 4-5 shots and 4-5 beers this evening.  Did have some emesis en route here.  Patient appears nauseated on arrival here.  Given IVF, zofran.  5:24 AM Patient has gotten out of bed, ambulated to the restroom without issue.  Does not currently have a ride home, refused to let staff contact his girlfriend or his mother.  He will be discharged and will take to the bus to pick up his car downtown.  He will return here for any new/acute changes.  Discharged home in stable condition.  Final Clinical Impressions(s) / ED Diagnoses   Final diagnoses:  Alcoholic intoxication without complication [facility])    ED Discharge Orders    None       Garlon Hatchet, PA-C 01/13/18 0541    Devoria Albe, MD 01/13/18 [phone]

## 2018-01-13 NOTE — ED Notes (Signed)
Bed: [id number] Expected date:  Expected time:  Means of arrival:  Comments: 21yo M ETOH
